# Patient Record
Sex: Female | Born: 1990 | Race: Black or African American | Hispanic: No | Marital: Single
Health system: Southern US, Community
[De-identification: ages and names within clinical notes are randomized; demographics above are authoritative.]

---
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
## 2019-02-13 ENCOUNTER — Emergency Department (HOSPITAL_COMMUNITY): Payer: BC Managed Care – PPO

## 2019-02-13 ENCOUNTER — Emergency Department (HOSPITAL_COMMUNITY)
Admission: EM | Admit: 2019-02-13 | Discharge: 2019-02-13 | Disposition: A | Discharge status: Home / Self Care | Payer: BC Managed Care – PPO | Attending: Emergency Medicine | Admitting: Emergency Medicine

## 2019-02-13 ENCOUNTER — Other Ambulatory Visit: Payer: Self-pay

## 2019-02-13 ENCOUNTER — Encounter (HOSPITAL_COMMUNITY): Payer: Self-pay | Admitting: Emergency Medicine

## 2019-02-13 DIAGNOSIS — Z20822 Contact with and (suspected) exposure to covid-19: Secondary | ICD-10-CM

## 2019-02-13 DIAGNOSIS — R079 Chest pain, unspecified: Secondary | ICD-10-CM | POA: Diagnosis present

## 2019-02-13 DIAGNOSIS — R0789 Other chest pain: Secondary | ICD-10-CM | POA: Diagnosis not present

## 2019-02-13 DIAGNOSIS — Z79899 Other long term (current) drug therapy: Secondary | ICD-10-CM | POA: Diagnosis not present

## 2019-02-13 DIAGNOSIS — Z20828 Contact with and (suspected) exposure to other viral communicable diseases: Secondary | ICD-10-CM | POA: Diagnosis not present

## 2019-02-13 LAB — CBC
HCT: 39.1 % (ref 36.0–46.0)
Hemoglobin: 12 g/dL (ref 12.0–15.0)
MCH: 21.7 pg — ABNORMAL LOW (ref 26.0–34.0)
MCHC: 30.7 g/dL (ref 30.0–36.0)
MCV: 70.7 fL — ABNORMAL LOW (ref 80.0–100.0)
Platelets: 346 10*3/uL (ref 150–400)
RBC: 5.53 MIL/uL — ABNORMAL HIGH (ref 3.87–5.11)
RDW: 15.1 % (ref 11.5–15.5)
WBC: 7.5 10*3/uL (ref 4.0–10.5)
nRBC: 0 % (ref 0.0–0.2)

## 2019-02-13 LAB — BASIC METABOLIC PANEL
Anion gap: 9 (ref 5–15)
BUN: 8 mg/dL (ref 6–20)
CO2: 24 mmol/L (ref 22–32)
Calcium: 9.3 mg/dL (ref 8.9–10.3)
Chloride: 107 mmol/L (ref 98–111)
Creatinine, Ser: 0.74 mg/dL (ref 0.44–1.00)
GFR calc Af Amer: >60 mL/min (ref >60)
GFR calc non Af Amer: >60 mL/min (ref >60)
Glucose, Bld: 107 mg/dL — ABNORMAL HIGH (ref 70–99)
Potassium: 3.6 mmol/L (ref 3.5–5.1)
Sodium: 140 mmol/L (ref 135–145)

## 2019-02-13 LAB — I-STAT BETA HCG BLOOD, ED (MC, WL, AP ONLY): I-stat hCG, quantitative: <5 m[IU]/mL (ref <5)

## 2019-02-13 LAB — TROPONIN I (HIGH SENSITIVITY)
Troponin I (High Sensitivity): <2 ng/L (ref <18)
Troponin I (High Sensitivity): <2 ng/L (ref <18)

## 2019-02-13 MED ORDER — SODIUM CHLORIDE 0.9% FLUSH: 3.0000 mL | Freq: Once | INTRAVENOUS | Status: DC

## 2019-02-13 NOTE — ED Triage Notes (Signed)
Pt reports chest pain starting an hour prior to arrival. Reports a cough for x3 days and feels short of breath. When lying down felt like pressure was on her chest.

## 2019-02-13 NOTE — ED Provider Notes (Signed)
[facility]   CSN: [id number] Arrival date & time: 02/13/19  1743    History   Chief Complaint Chief Complaint  Patient presents with  . Chest Pain    HPI Sonya Simmons is a 28 y.o. female with history of seasonal allergies who presents with a 4-day history of cough and chest tightness.  Patient has had a dull ache, worse when she coughs.  She is also had some shortness of breath, worse with lying down.  She denies any fevers.  She did have an episode of lightheadedness earlier, but she did not have any syncope.  She has been around couple family members with cold symptoms.  There was someone who tested positive for COVID that she has been around within the last 2 weeks.  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  Patient denies any abdominal pain, nausea, vomiting, diarrhea.  She reports she has had a weird feeling on her tongue, but denies any significant loss of taste or smell.  She denies any nasal congestion or ear pain.  She has been taking a cough suppressant and ibuprofen and Tylenol.  Patient denies any recent long trips, surgeries, new leg pain or swelling, history of blood clots, exogenous estrogen use, pleuritic chest pain.     HPI  History reviewed. No pertinent past medical history.  There are no active problems to display for this patient.      OB History   No obstetric history on file.      Home Medications    Prior to Admission medications   Medication Sig Start Date End Date Taking? Authorizing Provider  acetaminophen (TYLENOL) 650 MG CR tablet Take 1,300 mg by mouth every 8 (eight) hours as needed for pain (tooth).   Yes [provider]  ibuprofen (ADVIL) 200 MG tablet Take 400 mg by mouth every 6 (six) hours as needed for moderate pain (tooth pain).   Yes [provider]  Nutritional Supplements (COLD AND FLU PO) Take 2 tablets by mouth as needed (cold and flu).    Yes [provider]    Family History History reviewed. No pertinent family history.  Social History Social History   Tobacco Use  . Smoking status: Not on file  Substance Use Topics  . Alcohol use: Not on file  . Drug use: Not on file     Allergies   Amoxicillin   Review of Systems Review of Systems  Constitutional: Negative for chills and fever.  HENT: Positive for sore throat. Negative for congestion, ear pain and facial swelling.   Respiratory: Positive for cough, chest tightness and shortness of breath.   Cardiovascular: Positive for chest pain (dull ache with coughing).  Gastrointestinal: Negative for abdominal pain, nausea and vomiting.  Genitourinary: Negative for dysuria.  Musculoskeletal: Negative for back pain.  Skin: Negative for rash and wound.  Neurological: Positive for light-headedness. Negative for headaches.  Psychiatric/Behavioral: The patient is not nervous/anxious.      Physical Exam Updated Vital Signs BP 129/87   Pulse 62   Temp 98.5 F (36.9 C) (Oral)   Resp 12   Ht 5\' 4"  (1.626 m)   Wt 68 kg   LMP 02/01/2019 (Approximate)   SpO2 100%   BMI 25.75 kg/m   Physical Exam Vitals signs and nursing note reviewed.  Constitutional:      General: She is not in acute distress.    Appearance: She  is well-developed. She is not diaphoretic.  HENT:     Head: Normocephalic and atraumatic.     Mouth/Throat:     Pharynx: No oropharyngeal exudate.  Eyes:     General: No scleral icterus.       Right eye: No discharge.        Left eye: No discharge.     Conjunctiva/sclera: Conjunctivae normal.     Pupils: Pupils are equal, round, and reactive to light.  Neck:     Musculoskeletal: Normal range of motion and neck supple.     Thyroid: No thyromegaly.  Cardiovascular:     Rate and Rhythm: Normal rate and regular rhythm.     Heart sounds: Normal heart sounds. No murmur. No friction rub. No gallop.   Pulmonary:     Effort: Pulmonary effort  is normal. No respiratory distress.     Breath sounds: Normal breath sounds. No stridor. No wheezing or rales.  Chest:     Chest wall: Tenderness (over sternum) present.  Abdominal:     General: Bowel sounds are normal. There is no distension.     Palpations: Abdomen is soft.     Tenderness: There is no abdominal tenderness. There is no guarding or rebound.  Musculoskeletal:     Right lower leg: She exhibits no tenderness. No edema.     Left lower leg: She exhibits no tenderness. No edema.  Lymphadenopathy:     Cervical: No cervical adenopathy.  Skin:    General: Skin is warm and dry.     Coloration: Skin is not pale.     Findings: No rash.  Neurological:     Mental Status: She is alert.     Coordination: Coordination normal.      ED Treatments / Results  Labs (all labs ordered are listed, but only abnormal results are displayed) Labs Reviewed  BASIC METABOLIC PANEL - Abnormal; Notable for the following components:      Result Value   Glucose, Bld 107 (*)    All other components within normal limits  CBC - Abnormal; Notable for the following components:   RBC 5.53 (*)    MCV 70.7 (*)    MCH 21.7 (*)    All other components within normal limits  NOVEL CORONAVIRUS, NAA (HOSPITAL ORDER, SEND-OUT TO REF LAB)  I-STAT BETA HCG BLOOD, ED ([facility], [facility], [facility] ONLY)  TROPONIN I (HIGH SENSITIVITY)  TROPONIN I (HIGH SENSITIVITY)    EKG None  Radiology Dg Chest 2 View  Result Date: 02/13/2019 CLINICAL DATA:  Central chest pain. EXAM: CHEST - 2 VIEW COMPARISON:  None. FINDINGS: Cardiomediastinal silhouette is normal. Mediastinal contours appear intact. There is no evidence of focal airspace consolidation, pleural effusion or pneumothorax. Osseous structures are without acute abnormality. Soft tissues are grossly normal. IMPRESSION: No active cardiopulmonary disease. Electronically Signed   By: Ted Mcalpineobrinka  Dimitrova M.D.   On: 02/13/2019 18:42    Procedures Procedures (including critical  care time)  Medications Ordered in ED Medications  sodium chloride flush (NS) 0.9 % injection 3 mL (has no administration in time range)     Initial Impression / Assessment and Plan / ED Course  I have reviewed the triage vital signs and the nursing notes.  Pertinent labs & imaging results that were available during my care of the patient were reviewed by me and considered in my medical decision making (see chart for details).        Patient presenting with a 4-day history of cough  and chest pain. She has also had some shortness of breath.  CBC, BMP unremarkable.  Troponin negative.  COVID-19 test pending.  Chest x-ray is negative.  EKG shows NSR with sinus arrhythmia and incomplete RBBB.  Patient's chest pain is reproducible on palpation and with cough.  Very low suspicion of ACS.  Suspect viral syndrome considering patient's cough, fatigue, lightheadedness.  Orthostatics are stable.  Home isolation discussed.  Hydration discussed.  Ibuprofen, Tylenol, cough suppressant discussed.  Return precautions discussed.  Patient understands and agrees with plan.  Patient vital stable throughout ED course and discharged in satisfactory condition.  Final Clinical Impressions(s) / ED Diagnoses   Final diagnoses:  Atypical chest pain  Suspected Covid-19 Virus Infection    ED Discharge Orders    None       Frederica Kuster, PA-C 02/13/19 2313    Dorie Rank, MD 02/14/19 [phone]

## 2019-02-13 NOTE — Discharge Instructions (Addendum)
You can continue taking ibuprofen and Tylenol as prescribed over-the-counter.  Continue taking cough suppressant as needed.  Make sure to get plenty of rest and drink plenty of fluids.  Please return the emergency department develop any worsening shortness of breath, passing out, fingers and lips turning blue, or any other concerning symptoms.  You will be called in 5 to 7 days with your COVID-19 test results.  You should not return to work until your symptoms have been present for 7 days and your your symptoms have been resolving for 3 days.

## 2019-02-15 LAB — NOVEL CORONAVIRUS, NAA (HOSP ORDER, SEND-OUT TO REF LAB; TAT 18-24 HRS): SARS-CoV-2, NAA: NOT DETECTED

## 2020-08-11 IMAGING — CR CHEST - 2 VIEW
2 series · 2 of 2 positions shown · non-contrast
Comparison: None.

CLINICAL DATA: Central chest pain.

EXAM:
CHEST - 2 VIEW

[chest pa]
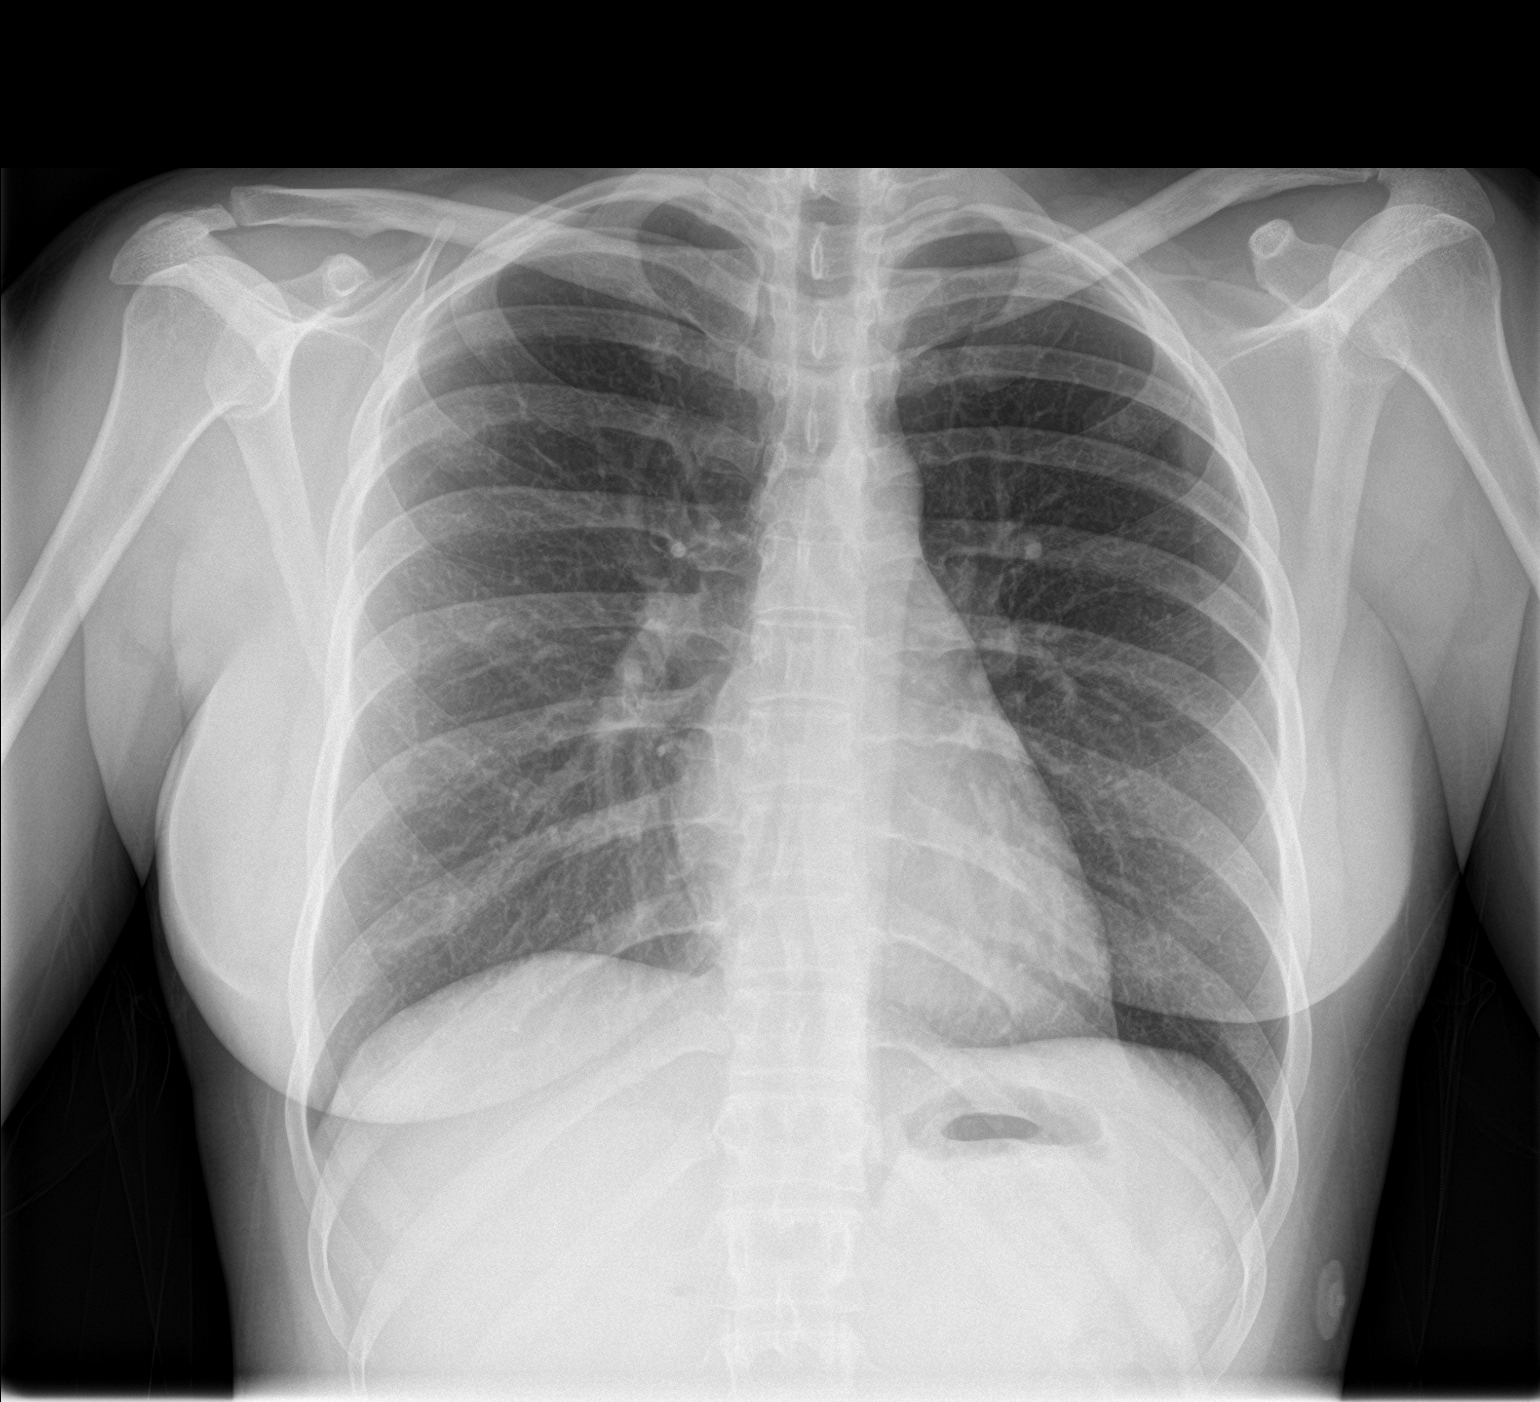

[chest lat]
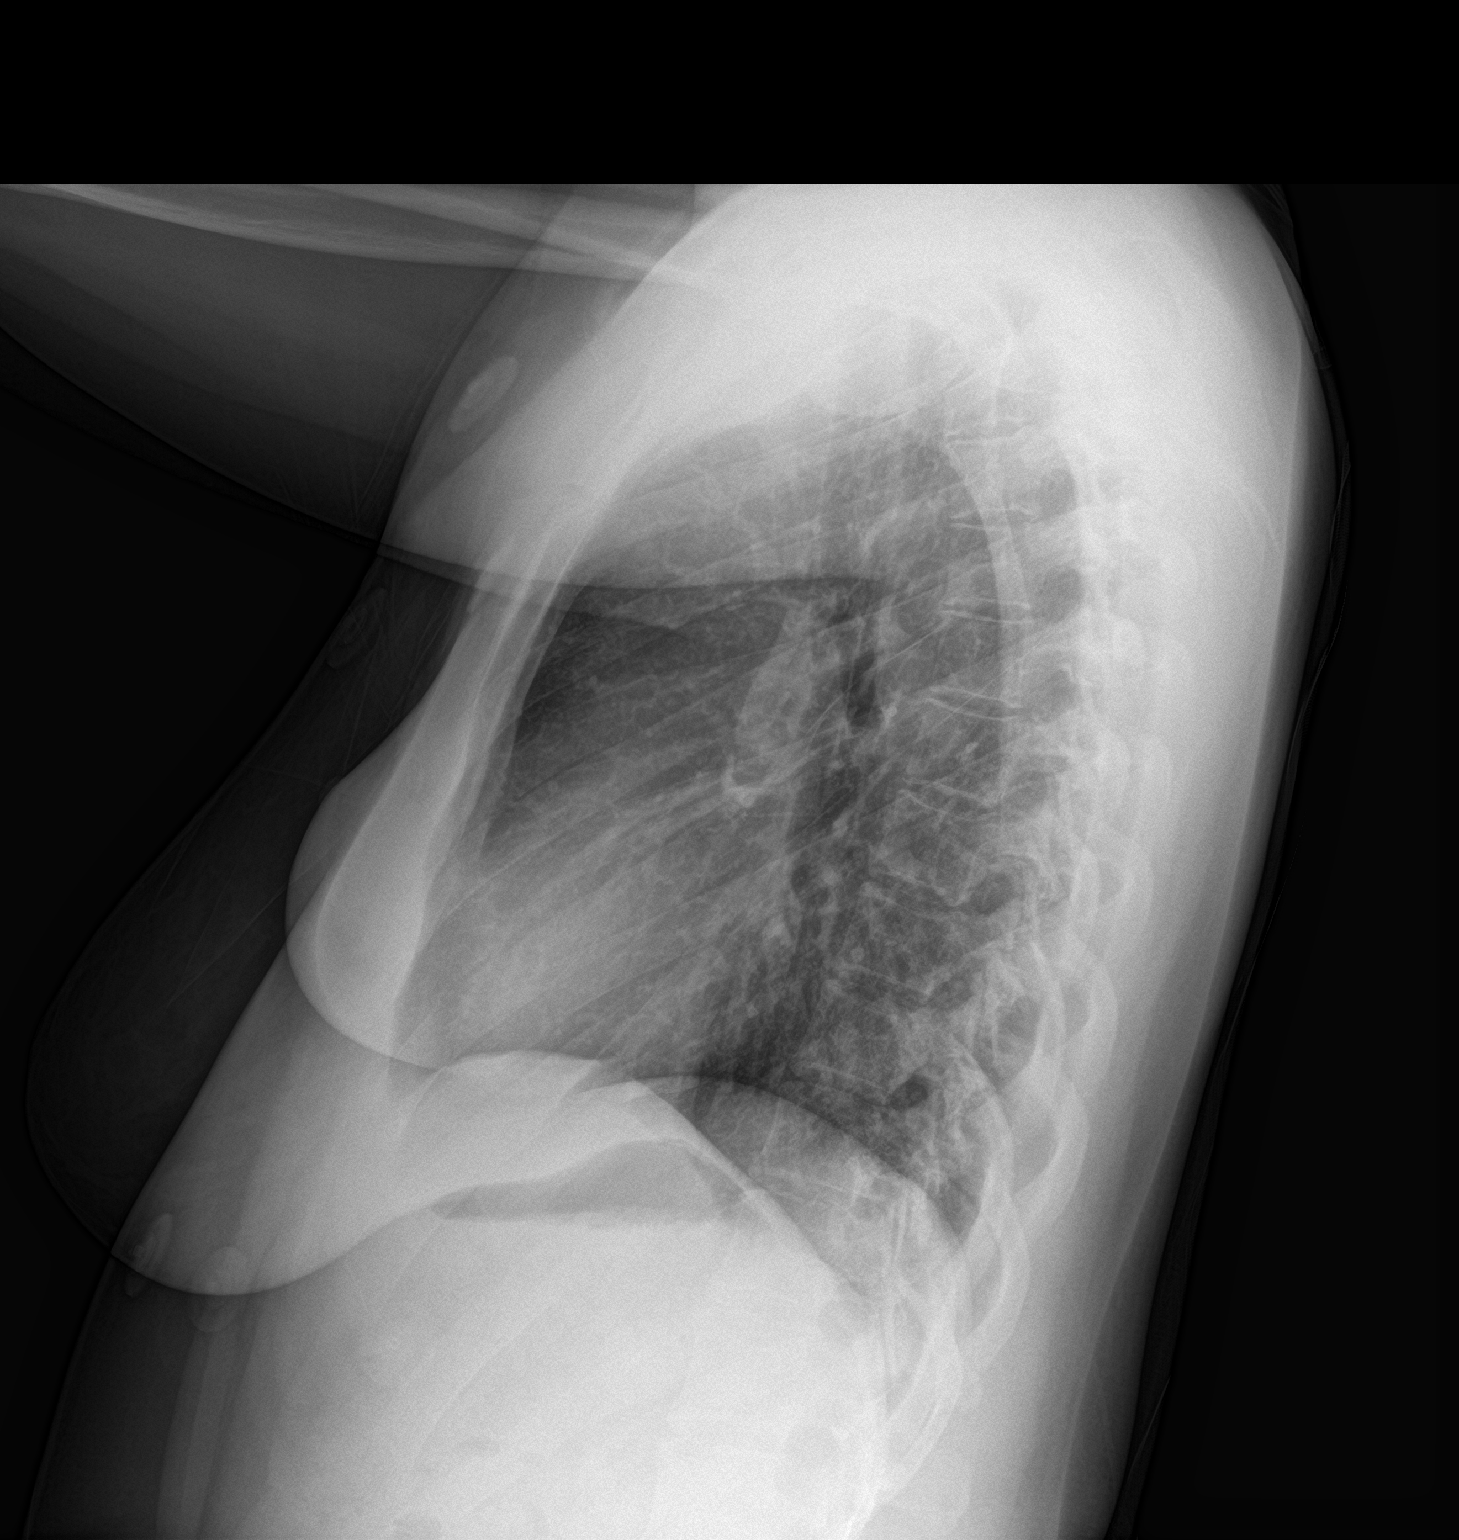

[2 of 2 positions shown; findings below may reference images not displayed]

FINDINGS: Cardiomediastinal silhouette is normal. Mediastinal contours appear
intact.

There is no evidence of focal airspace consolidation, pleural
effusion or pneumothorax.

Osseous structures are without acute abnormality. Soft tissues are
grossly normal.
IMPRESSION: No active cardiopulmonary disease.
